# Patient Record
Sex: Male | Born: 1995 | Hispanic: Yes | Marital: Married | Smoking: Never smoker
Health system: Southern US, Community
[De-identification: ages and names within clinical notes are randomized; demographics above are authoritative.]

---

## 2019-10-12 DIAGNOSIS — U071 COVID-19: Secondary | ICD-10-CM | POA: Diagnosis not present

## 2019-10-12 DIAGNOSIS — J189 Pneumonia, unspecified organism: Secondary | ICD-10-CM

## 2019-10-12 NOTE — ED Triage Notes (Signed)
Patient presents to Urgent Care with complaints of cough and shortness of breath since about 10 days ago. Patient reports he was diagnosed w/ covid a little over 2 weeks ago, thought his symptoms would be better by now. Pt is in NAD at this time, ambulatory from outside into exam room with no dyspnea. Pt has not been vaccinated for covid.

## 2019-10-12 NOTE — ED Provider Notes (Signed)
[facility]    CSN: [id number] Arrival date & time: 10/12/19  1252      History   Chief Complaint Chief Complaint  Patient presents with   Appointment    1:00   Cough    HPI Kenneth Taylor is a 24 y.o. male.   Patient was diagnosed with COVID19 infection about two weeks ago.  During the past 10 days he has had persistent cough, nasal congestion, fatigue, myalgias, chills/sweats, and shortness of breath with activity.  The history is provided by the patient.    History reviewed. No pertinent past medical history.  There are no problems to display for this patient.   History reviewed. No pertinent surgical history.     Home Medications    Prior to Admission medications   Medication Sig Start Date End Date Taking? Authorizing Provider  azithromycin (ZITHROMAX Z-PAK) 250 MG tablet Take 2 tabs today; then begin one tab once daily for 4 more days. 10/12/19   Lattie Haw, MD    Family History Family History  Problem Relation Age of Onset   Healthy Mother    Healthy Father     Social History Social History   Tobacco Use   Smoking status: Never Smoker   Smokeless tobacco: Never Used  Substance Use Topics   Alcohol use: Yes    Comment: rare   Drug use: Never     Allergies   Patient has no known allergies.   Review of Systems Review of Systems  + sore throat + cough No pleuritic pain No wheezing + nasal congestion + post-nasal drainage No sinus pain/pressure No itchy/red eyes No earache No hemoptysis + SOB ? fever, + chills No nausea No vomiting No abdominal pain No diarrhea No urinary symptoms No skin rash + fatigue + myalgias No headache     Physical Exam Triage Vital Signs ED Triage Vitals  Enc Vitals Group     BP 10/12/19 1320 118/83     Pulse Rate 10/12/19 1320 (!) 101     Resp 10/12/19 1320 16     Temp 10/12/19 1320 98.3 F (36.8 C)     Temp Source 10/12/19 1320 Oral     SpO2 10/12/19 1320 99 %      Weight --      Height --      Head Circumference --      Peak Flow --      Pain Score 10/12/19 1319 0     Pain Loc --      Pain Edu? --      Excl. in GC? --    No data found.  Updated Vital Signs BP 118/83 (BP Location: Left Arm)    Pulse (!) 101    Temp 98.3 F (36.8 C) (Oral)    Resp 16    SpO2 99%   Visual Acuity Right Eye Distance:   Left Eye Distance:   Bilateral Distance:    Right Eye Near:   Left Eye Near:    Bilateral Near:     Physical Exam Nursing notes and Vital Signs reviewed. Appearance:  Patient appears stated age, and in no acute distress Eyes:  Pupils are equal, round, and reactive to light and accomodation.  Extraocular movement is intact.  Conjunctivae are not inflamed  Ears:  Canals normal.  Tympanic membranes normal.  Nose:  Mildly congested turbinates.  No sinus tenderness.  Pharynx:  Normal Neck:  Supple.  Mildly enlarged lateral nodes are present, tender  to palpation on the left.   Lungs:  Clear to auscultation.  Breath sounds are equal.  Moving air well. Heart:  Regular rate and rhythm without murmurs, rubs, or gallops.  Rate 101. Abdomen:  Nontender without masses or hepatosplenomegaly.  Bowel sounds are present.  No CVA or flank tenderness.  Extremities:  No edema.  Skin:  No rash present.   UC Treatments / Results  Labs (all labs ordered are listed, but only abnormal results are displayed) Labs Reviewed - No data to display  EKG   Radiology DG Chest 2 View  Result Date: 10/12/2019 CLINICAL DATA:  Cough, COVID-19 positive EXAM: CHEST - 2 VIEW COMPARISON:  None. FINDINGS: The heart size and mediastinal contours are within normal limits. Subtle airspace opacity within the right perihilar region. Left lung appears clear. No pleural effusion or pneumothorax. The visualized skeletal structures are unremarkable. IMPRESSION: Subtle right perihilar airspace opacity suspicious for developing pneumonia. Electronically Signed   By: Duanne Guess D.O.    On: 10/12/2019 14:40    Procedures Procedures (including critical care time)  Medications Ordered in UC Medications - No data to display  Initial Impression / Assessment and Plan / UC Course  I have reviewed the triage vital signs and the nursing notes.  Pertinent labs & imaging results that were available during my care of the patient were reviewed by me and considered in my medical decision making (see chart for details).    Begin Z-pak. Followup with Family Doctor if not improved in one week.    Final Clinical Impressions(s) / UC Diagnoses   Final diagnoses:  Community acquired pneumonia of right lung, unspecified part of lung     Discharge Instructions     Take plain guaifenesin (1200mg  extended release tabs such as Mucinex) twice daily, with plenty of water, for cough and congestion.  May add Pseudoephedrine (30mg , one or two every 4 to 6 hours) for sinus congestion.  Get adequate rest.   May take Delsym Cough Suppressant ("12 Hour Cough Relief") at bedtime for nighttime cough.  Stop all antihistamines for now, and other non-prescription cough/cold preparations.   Recommend obtaining a pulse oximeter (oxygen meter) and check your oxygen regularly.  Go to an emergency room if oxygen begins decreasing and you develop increasing shortness of breath.     ED Prescriptions    Medication Sig Dispense Auth. Provider   azithromycin (ZITHROMAX Z-PAK) 250 MG tablet Take 2 tabs today; then begin one tab once daily for 4 more days. 6 tablet , MD        , MD 10/15/19 [phone]

## 2019-10-12 NOTE — Discharge Instructions (Addendum)
Take plain guaifenesin (1200mg  extended release tabs such as Mucinex) twice daily, with plenty of water, for cough and congestion.  May add Pseudoephedrine (30mg , one or two every 4 to 6 hours) for sinus congestion.  Get adequate rest.   May take Delsym Cough Suppressant ("12 Hour Cough Relief") at bedtime for nighttime cough.  Stop all antihistamines for now, and other non-prescription cough/cold preparations.   Recommend obtaining a pulse oximeter (oxygen meter) and check your oxygen regularly.  Go to an emergency room if oxygen begins decreasing and you develop increasing shortness of breath.

## 2021-04-02 DIAGNOSIS — J069 Acute upper respiratory infection, unspecified: Secondary | ICD-10-CM

## 2021-04-02 DIAGNOSIS — J039 Acute tonsillitis, unspecified: Secondary | ICD-10-CM

## 2021-04-02 NOTE — ED Triage Notes (Signed)
Pt presents with loss of voice, chills, congestion, HA x 3 days.

## 2021-04-02 NOTE — Discharge Instructions (Signed)
Strep testing and COVID testing are both negative This is likely caused by another virus I am concerned about the tonsil infection.  I will going to give you a week of antibiotics Tylenol or ibuprofen for pain and fever

## 2021-04-02 NOTE — ED Provider Notes (Signed)
[facility]    CSN: [id number] Arrival date & time: 04/02/21  1137      History   Chief Complaint Chief Complaint  Patient presents with   Laryngitis   Nasal Congestion   Chills   Headache    HPI Kenneth Taylor is a 26 y.o. male.   HPI  Patient is seen for upper respiratory infection.  He states he has a significant sore throat.  He also has headache, body aches, fever and chills.  He has had some laryngitis and nasal congestion the last couple of days.  He has been sick for 3 to 4 days.  No known exposure to illness.  History reviewed. No pertinent past medical history.  There are no problems to display for this patient.   History reviewed. No pertinent surgical history.     Home Medications    Prior to Admission medications   Medication Sig Start Date End Date Taking? Authorizing Provider  amoxicillin (AMOXIL) 875 MG tablet Take 1 tablet (875 mg total) by mouth 2 (two) times daily. 04/02/21  Yes Eustace Moore, MD  guaiFENesin (MUCINEX) 600 MG 12 hr tablet Take by mouth 2 (two) times daily.   Yes [provider]    Family History Family History  Problem Relation Age of Onset   Healthy Mother    Healthy Father     Social History Social History   Tobacco Use   Smoking status: Never   Smokeless tobacco: Never  Substance Use Topics   Alcohol use: Yes    Comment: rare   Drug use: Never     Allergies   Patient has no known allergies.   Review of Systems Review of Systems See HPI  Physical Exam Triage Vital Signs ED Triage Vitals  Enc Vitals Group     BP 04/02/21 1148 (!) 150/95     Pulse Rate 04/02/21 1148 62     Resp 04/02/21 1148 14     Temp 04/02/21 1148 98.2 F (36.8 C)     Temp Source 04/02/21 1148 Oral     SpO2 04/02/21 1148 99 %     Weight 04/02/21 1150 280 lb (127 kg)     Height --      Head Circumference --      Peak Flow --      Pain Score 04/02/21 1150 4     Pain Loc --      Pain Edu? --      Excl. in  GC? --    No data found.  Updated Vital Signs BP (!) 150/95 (BP Location: Left Arm)    Pulse 62    Temp 98.2 F (36.8 C) (Oral)    Resp 14    Wt 127 kg    SpO2 99%   :     Physical Exam Constitutional:      General: He is not in acute distress.    Appearance: He is well-developed and normal weight. He is ill-appearing.  HENT:     Head: Normocephalic and atraumatic.     Right Ear: Tympanic membrane and ear canal normal.     Left Ear: Tympanic membrane and ear canal normal.     Nose: Nose normal. No congestion.     Mouth/Throat:     Mouth: Mucous membranes are moist.     Pharynx: Posterior oropharyngeal erythema present.     Comments: Tonsils are large and deeply erythematous with no exudate Eyes:     Conjunctiva/sclera:  Conjunctivae normal.     Pupils: Pupils are equal, round, and reactive to light.  Cardiovascular:     Rate and Rhythm: Normal rate and regular rhythm.     Heart sounds: Normal heart sounds.  Pulmonary:     Effort: Pulmonary effort is normal. No respiratory distress.     Breath sounds: Normal breath sounds.  Abdominal:     General: There is no distension.     Palpations: Abdomen is soft.  Musculoskeletal:        General: Normal range of motion.     Cervical back: Normal range of motion.  Lymphadenopathy:     Cervical: Cervical adenopathy present.  Skin:    General: Skin is warm and dry.  Neurological:     Mental Status: He is alert.  Psychiatric:        Mood and Affect: Mood normal.        Behavior: Behavior normal.     UC Treatments / Results  Labs (all labs ordered are listed, but only abnormal results are displayed) Labs Reviewed  CULTURE, GROUP A STREP  POC SARS CORONAVIRUS 2 AG -  ED  POCT RAPID STREP A (OFFICE)    EKG   Radiology No results found.  Procedures Procedures (including critical care time)  Medications Ordered in UC Medications - No data to display  Initial Impression / Assessment and Plan / UC Course  I have  reviewed the triage vital signs and the nursing notes.  Pertinent labs & imaging results that were available during my care of the patient were reviewed by me and considered in my medical decision making (see chart for details).     Strep testing and COVID testing are negative.  Strep culture is sent.  Because of the patient's tonsillitis will treat with antibioticsEnding culture report. Final Clinical Impressions(s) / UC Diagnoses   Final diagnoses:  Acute tonsillitis, unspecified etiology  Upper respiratory tract infection, unspecified type     Discharge Instructions      Strep testing and COVID testing are both negative This is likely caused by another virus I am concerned about the tonsil infection.  I will going to give you a week of antibiotics Tylenol or ibuprofen for pain and fever    ED Prescriptions     Medication Sig Dispense Auth. Provider   amoxicillin (AMOXIL) 875 MG tablet Take 1 tablet (875 mg total) by mouth 2 (two) times daily. 14 tablet Eustace Moore, MD      PDMP not reviewed this encounter.   Eustace Moore, MD 04/02/21 [phone]

## 2021-04-02 NOTE — Telephone Encounter (Signed)
Pt st pharmacy never received script. Verbal called into pharmacy. Pt notified.

## 2021-07-27 IMAGING — DX DG CHEST 2V
2 series · 2 of 2 positions shown · non-contrast
Comparison: None.

CLINICAL DATA: Cough, [id number] positive

EXAM:
CHEST - 2 VIEW

[chest pa]
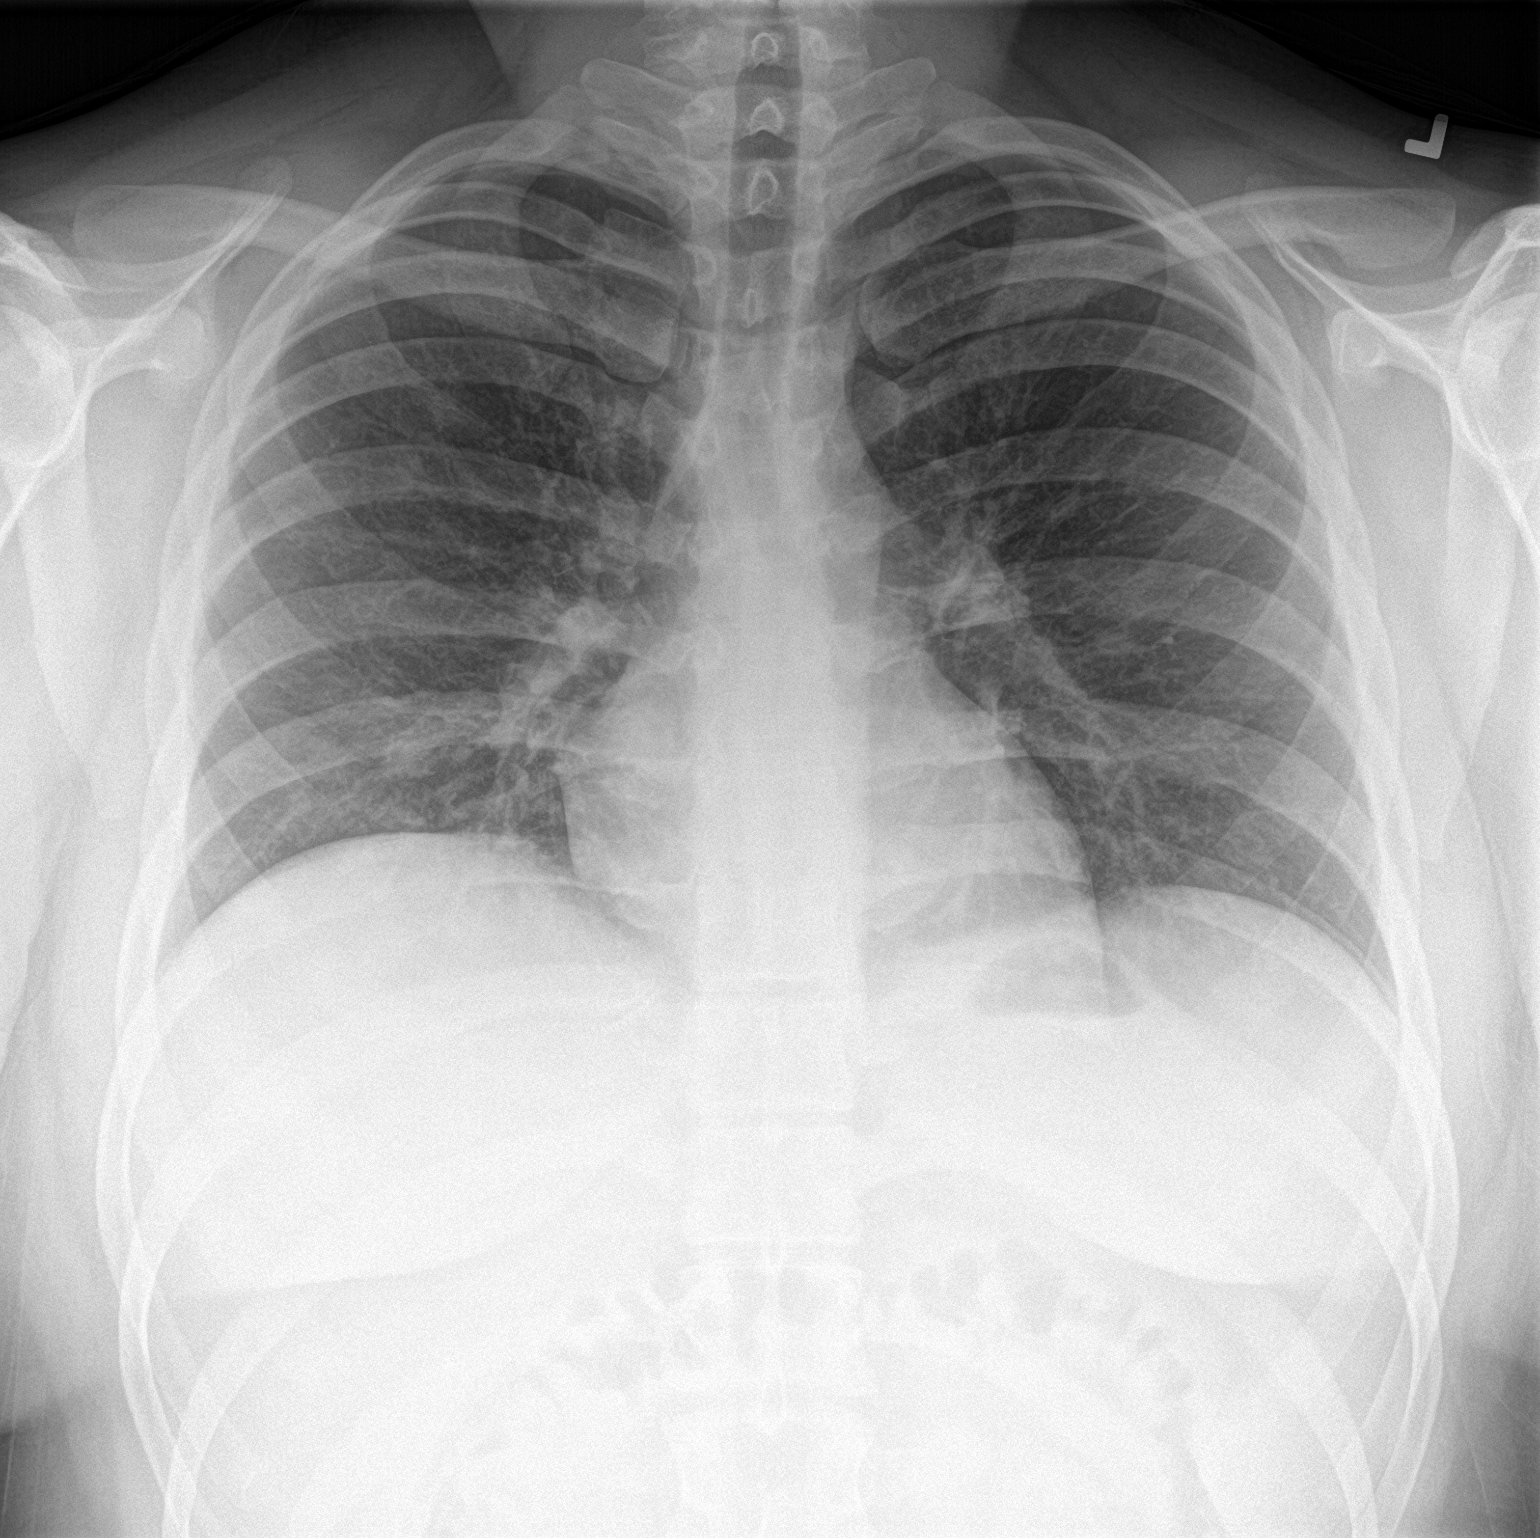

[chest lat]
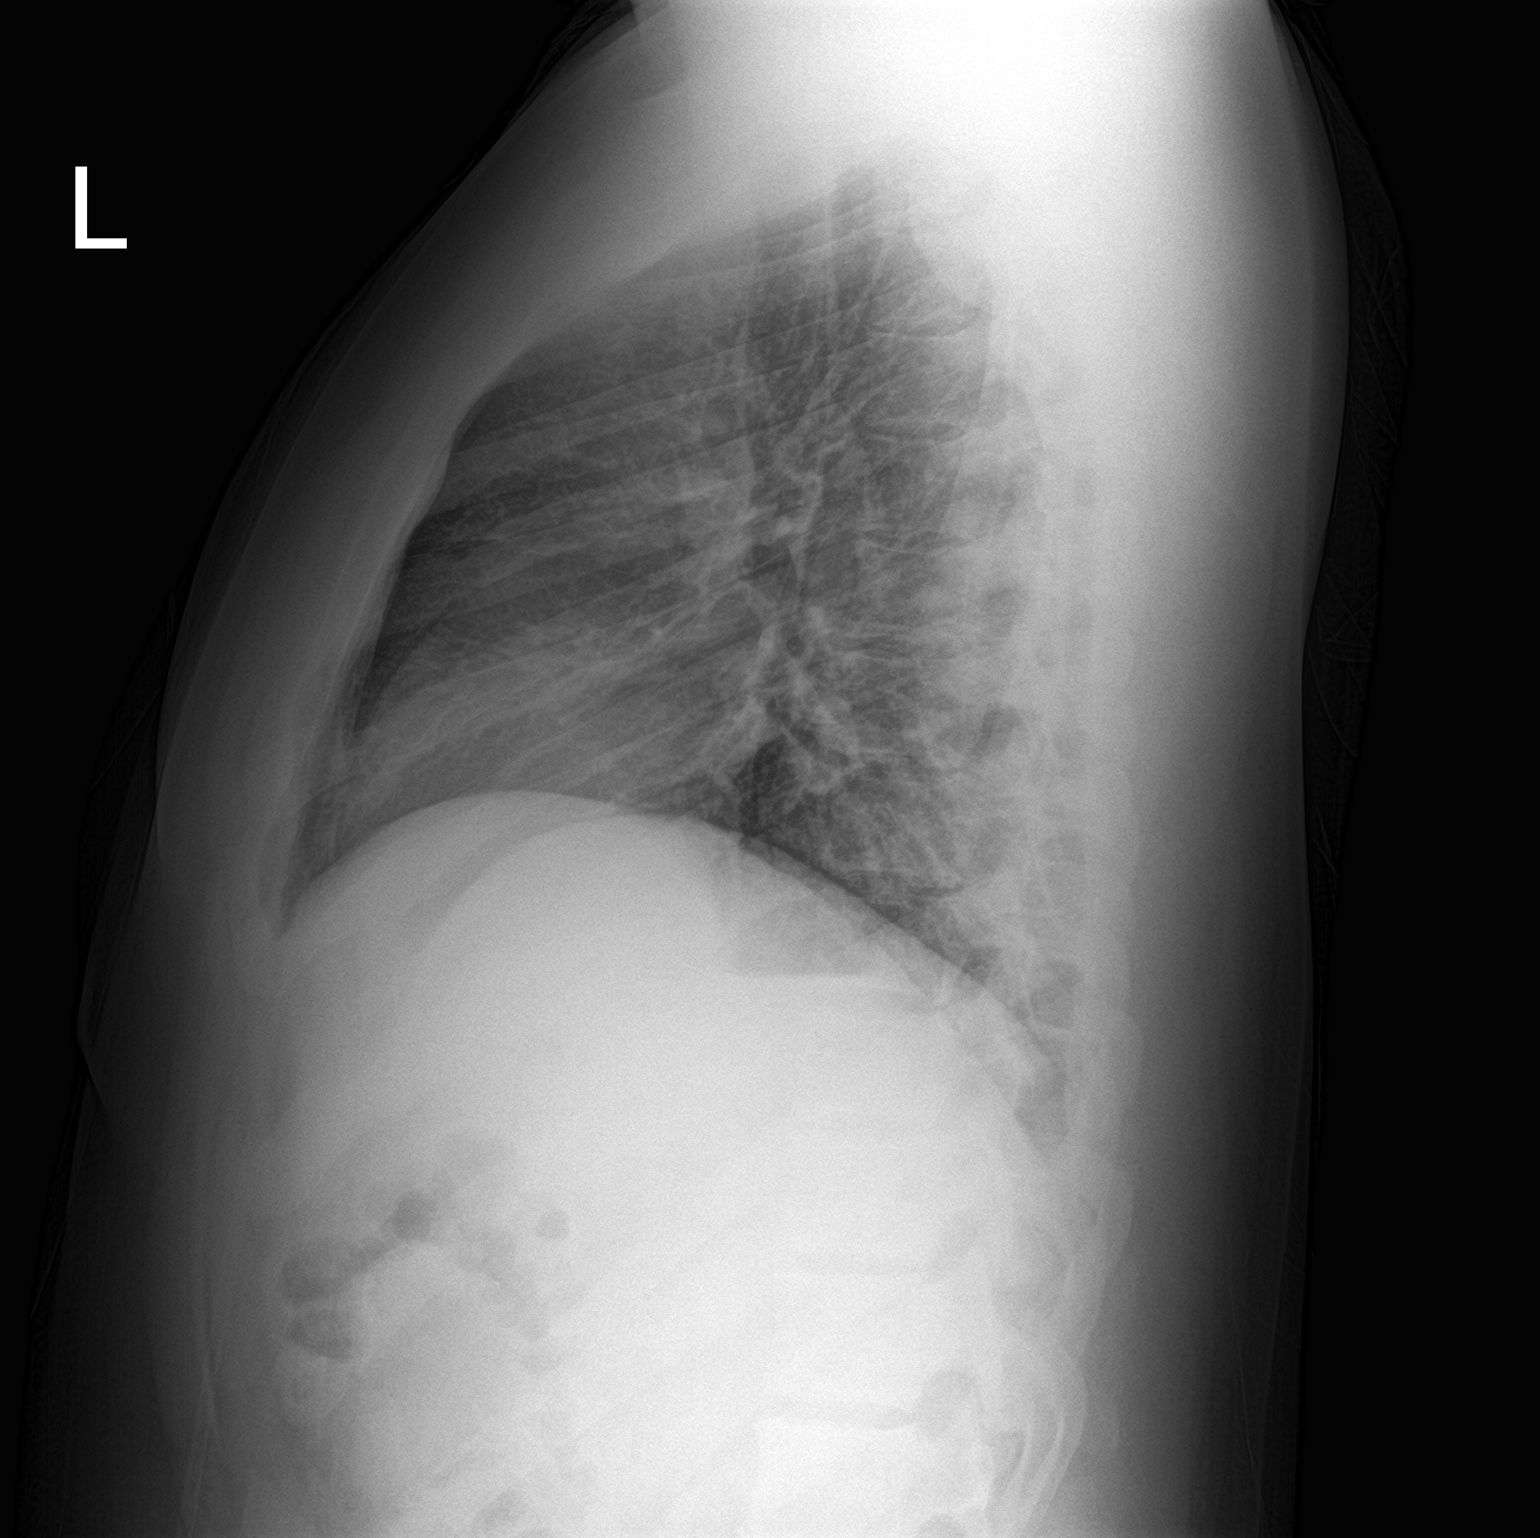

[2 of 2 positions shown; findings below may reference images not displayed]

FINDINGS: The heart size and mediastinal contours are within normal limits.
Subtle airspace opacity within the right perihilar region. Left lung
appears clear. No pleural effusion or pneumothorax. The visualized
skeletal structures are unremarkable.
IMPRESSION: Subtle right perihilar airspace opacity suspicious for developing
pneumonia.

## 2023-09-03 ENCOUNTER — Other Ambulatory Visit: Payer: Self-pay

## 2023-09-03 DIAGNOSIS — M62830 Muscle spasm of back: Secondary | ICD-10-CM

## 2023-09-03 DIAGNOSIS — S39012A Strain of muscle, fascia and tendon of lower back, initial encounter: Secondary | ICD-10-CM

## 2023-09-03 DIAGNOSIS — S3992XA Unspecified injury of lower back, initial encounter: Secondary | ICD-10-CM

## 2023-09-03 MED ADMIN — Ketorolac Tromethamine IM Inj 60 MG/2ML (30 MG/ML): 60 mg | INTRAMUSCULAR | NDC 00409379601

## 2023-09-03 MED ADMIN — Methylprednisolone Acetate Inj Susp 80 MG/ML: 80 mg | INTRAMUSCULAR | NDC 70121157405

## 2023-09-03 NOTE — Discharge Instructions (Addendum)
 Advised patient of lumbar spine x-ray results with hardcopy and image provided.  Advised patient to RICE affected area of lower back for 30 minutes 3 times daily for the next 3 days.  Advised patient to take medications as directed with food to completion.  Advised patient to take Robaxin  daily or as needed for accompanying muscle spasms of lower back.

## 2023-09-03 NOTE — ED Triage Notes (Signed)
 Pt c/o back pain since 7/19 when he fell. Swelling to LT lower back side. Ibuprofen, icyhot and ice prn

## 2023-09-03 NOTE — ED Provider Notes (Signed)
 [facility]    CSN: [id number] Arrival date & time: 09/03/23  1432      History   Chief Complaint Chief Complaint  Patient presents with   Back Pain    HPI Kenneth Taylor is a 28 y.o. male.   HPI 28 year old male presents with lower back pain since 08/27/2023 when he fell accidentally.  Reports swelling to left side of lower back.  Patient reports taking ibuprofen and IcyHot as needed.  History reviewed. No pertinent past medical history.  There are no active problems to display for this patient.   History reviewed. No pertinent surgical history.     Home Medications    Prior to Admission medications   Medication Sig Start Date End Date Taking? Authorizing Provider  celecoxib  (CELEBREX ) 200 MG capsule Take 1 capsule (200 mg total) by mouth daily for 15 days. 09/03/23 09/18/23 Yes Teddy Sharper, FNP  methocarbamol  (ROBAXIN ) 500 MG tablet Take 1 tablet (500 mg total) by mouth 3 (three) times daily as needed. 09/03/23  Yes Teddy Sharper, FNP  predniSONE  (STERAPRED UNI-PAK 21 TAB) 10 MG (21) TBPK tablet Take by mouth daily. Take 6 tabs by mouth daily  for 2 days, then 5 tabs for 2 days, then 4 tabs for 2 days, then 3 tabs for 2 days, 2 tabs for 2 days, then 1 tab by mouth daily for 2 days 09/03/23  Yes Teddy Sharper, FNP  guaiFENesin (MUCINEX) 600 MG 12 hr tablet Take by mouth 2 (two) times daily.    [provider]    Family History Family History  Problem Relation Age of Onset   Healthy Mother    Healthy Father     Social History Social History   Tobacco Use   Smoking status: Never   Smokeless tobacco: Never  Substance Use Topics   Alcohol use: Yes    Comment: rare   Drug use: Never     Allergies   Patient has no known allergies.   Review of Systems Review of Systems  Musculoskeletal:  Positive for back pain.     Physical Exam Triage Vital Signs ED Triage Vitals  Encounter Vitals Group     BP      Girls Systolic BP Percentile       Girls Diastolic BP Percentile      Boys Systolic BP Percentile      Boys Diastolic BP Percentile      Pulse      Resp      Temp      Temp src      SpO2      Weight      Height      Head Circumference      Peak Flow      Pain Score      Pain Loc      Pain Education      Exclude from Growth Chart    No data found.  Updated Vital Signs BP 128/74 (BP Location: Right Arm)   Pulse (!) 56   Temp 98.3 F (36.8 C) (Oral)   Resp 17   SpO2 98%   Visual Acuity Right Eye Distance:   Left Eye Distance:   Bilateral Distance:    Right Eye Near:   Left Eye Near:    Bilateral Near:     Physical Exam Vitals and nursing note reviewed.  Constitutional:      Appearance: Normal appearance. He is obese.  HENT:     Head:  Normocephalic and atraumatic.     Mouth/Throat:     Mouth: Mucous membranes are moist.     Pharynx: Oropharynx is clear.  Eyes:     Extraocular Movements: Extraocular movements intact.     Conjunctiva/sclera: Conjunctivae normal.     Pupils: Pupils are equal, round, and reactive to light.  Cardiovascular:     Rate and Rhythm: Normal rate and regular rhythm.     Pulses: Normal pulses.     Heart sounds: Normal heart sounds.  Pulmonary:     Effort: Pulmonary effort is normal.     Breath sounds: Normal breath sounds. No wheezing, rhonchi or rales.  Musculoskeletal:        General: Normal range of motion.     Cervical back: Normal range of motion and neck supple.     Comments: Lumbar spine (inferior aspect left-sided over left-sided external obliques): Significant soft tissue swelling noted from contusion please see image below  Skin:    General: Skin is warm and dry.  Neurological:     General: No focal deficit present.     Mental Status: He is alert and oriented to person, place, and time. Mental status is at baseline.  Psychiatric:        Mood and Affect: Mood normal.        Behavior: Behavior normal.    UC Treatments / Results  Labs (all labs  ordered are listed, but only abnormal results are displayed) Labs Reviewed - No data to display  EKG   Radiology DG Lumbar Spine Complete Result Date: 09/03/2023 CLINICAL DATA:  Left lower back pain. EXAM: LUMBAR SPINE - COMPLETE 4+ VIEW COMPARISON:  None Available. FINDINGS: Bilateral L5 pars defects with 8 mm of anterolisthesis of L5 on S1. No acute fracture. Disc spaces maintained. SI joints symmetric and unremarkable. IMPRESSION: Bilateral L5 pars defects with grade 1 anterolisthesis. No acute bony abnormality. Electronically Signed   By: Franky Crease M.D.   On: 09/03/2023 15:46    Procedures Procedures (including critical care time)  Medications Ordered in UC Medications  ketorolac  (TORADOL ) injection 60 mg (60 mg Intramuscular Given 09/03/23 1511)  methylPREDNISolone  acetate (DEPO-MEDROL ) injection 80 mg (80 mg Intramuscular Given 09/03/23 1511)    Initial Impression / Assessment and Plan / UC Course  I have reviewed the triage vital signs and the nursing notes.  Pertinent labs & imaging results that were available during my care of the patient were reviewed by me and considered in my medical decision making (see chart for details).     MDM: 1.  Strain of lumbar region, initial encounter-IM Toradol  60 mg given once in clinic, IM Depo-Medrol  80 mg given once in clinic, Rx'd Sterapred Unipak (42 tab 10 mg taper); 2.  Injury of lower back, initial encounter-lumbar spine x-ray results revealed above, probable contusion Rx'd Celebrex  200 mg capsule: Take 1 capsule daily x 15 days; 3.  Muscle spasm of back-Rx'd Robaxin  500 mg tablet take 1 to 2 tablets daily, as needed for back spasms. Advised patient of lumbar spine x-ray results with hardcopy and image provided.  Advised patient to RICE affected area of lower back for 30 minutes 3 times daily for the next 3 days.  Advised patient to take medications as directed with food to completion.  Advised patient to take Robaxin  daily or as needed  for accompanying muscle spasms of lower back.  Patient discharged home, hemodynamically stable.  Final Clinical Impressions(s) / UC Diagnoses   Final diagnoses:  Strain  of lumbar region, initial encounter  Injury of low back, initial encounter  Muscle spasm of back     Discharge Instructions      Advised patient of lumbar spine x-ray results with hardcopy and image provided.  Advised patient to RICE affected area of lower back for 30 minutes 3 times daily for the next 3 days.  Advised patient to take medications as directed with food to completion.  Advised patient to take Robaxin  daily or as needed for accompanying muscle spasms of lower back.     ED Prescriptions     Medication Sig Dispense Auth. Provider   celecoxib  (CELEBREX ) 200 MG capsule Take 1 capsule (200 mg total) by mouth daily for 15 days. 15 capsule Joh Rao, FNP   methocarbamol  (ROBAXIN ) 500 MG tablet Take 1 tablet (500 mg total) by mouth 3 (three) times daily as needed. 30 tablet Galvin Aversa, FNP   predniSONE  (STERAPRED UNI-PAK 21 TAB) 10 MG (21) TBPK tablet Take by mouth daily. Take 6 tabs by mouth daily  for 2 days, then 5 tabs for 2 days, then 4 tabs for 2 days, then 3 tabs for 2 days, 2 tabs for 2 days, then 1 tab by mouth daily for 2 days 42 tablet Teddy Sharper, FNP      PDMP not reviewed this encounter.   Teddy Sharper, FNP 09/03/23 1557
